# Patient Record
Sex: Female | Born: 1980
Health system: Southern US, Community
[De-identification: ages and names within clinical notes are randomized; demographics above are authoritative.]

## PROBLEM LIST (undated history)

## (undated) HISTORY — PX: MEDIAL COLLATERAL LIGAMENT REPAIR, KNEE: SHX2019

---

## 2014-09-11 ENCOUNTER — Other Ambulatory Visit: Payer: Self-pay | Admitting: Specialist

## 2014-09-20 ENCOUNTER — Ambulatory Visit: Payer: BLUE CROSS/BLUE SHIELD

## 2016-02-28 ENCOUNTER — Ambulatory Visit: Payer: Self-pay

## 2018-10-24 DIAGNOSIS — Z23 Encounter for immunization: Secondary | ICD-10-CM | POA: Diagnosis not present

## 2019-12-02 ENCOUNTER — Telehealth: Payer: BC Managed Care – PPO | Admitting: Nurse Practitioner

## 2019-12-02 ENCOUNTER — Encounter: Payer: Self-pay | Admitting: Emergency Medicine

## 2019-12-02 ENCOUNTER — Emergency Department: Payer: BC Managed Care – PPO

## 2019-12-02 ENCOUNTER — Other Ambulatory Visit: Payer: Self-pay

## 2019-12-02 DIAGNOSIS — R059 Cough, unspecified: Secondary | ICD-10-CM | POA: Diagnosis not present

## 2019-12-02 DIAGNOSIS — R0602 Shortness of breath: Secondary | ICD-10-CM | POA: Diagnosis not present

## 2019-12-02 DIAGNOSIS — J1282 Pneumonia due to coronavirus disease 2019: Secondary | ICD-10-CM | POA: Diagnosis not present

## 2019-12-02 DIAGNOSIS — U071 COVID-19: Secondary | ICD-10-CM | POA: Insufficient documentation

## 2019-12-02 DIAGNOSIS — J9811 Atelectasis: Secondary | ICD-10-CM | POA: Diagnosis not present

## 2019-12-02 LAB — BASIC METABOLIC PANEL
Anion gap: 13 (ref 5–15)
BUN: 10 mg/dL (ref 6–20)
CO2: 23 mmol/L (ref 22–32)
Calcium: 8.4 mg/dL — ABNORMAL LOW (ref 8.9–10.3)
Chloride: 107 mmol/L (ref 98–111)
Creatinine, Ser: 0.7 mg/dL (ref 0.44–1.00)
GFR, Estimated: >60 mL/min (ref >60)
Glucose, Bld: 170 mg/dL — ABNORMAL HIGH (ref 70–99)
Potassium: 3.4 mmol/L — ABNORMAL LOW (ref 3.5–5.1)
Sodium: 143 mmol/L (ref 135–145)

## 2019-12-02 LAB — CBC
HCT: 45.8 % (ref 36.0–46.0)
Hemoglobin: 15 g/dL (ref 12.0–15.0)
MCH: 25.8 pg — ABNORMAL LOW (ref 26.0–34.0)
MCHC: 32.8 g/dL (ref 30.0–36.0)
MCV: 78.7 fL — ABNORMAL LOW (ref 80.0–100.0)
Platelets: 299 10*3/uL (ref 150–400)
RBC: 5.82 MIL/uL — ABNORMAL HIGH (ref 3.87–5.11)
RDW: 14.3 % (ref 11.5–15.5)
WBC: 3.3 10*3/uL — ABNORMAL LOW (ref 4.0–10.5)
nRBC: 0 % (ref 0.0–0.2)

## 2019-12-02 LAB — TROPONIN I (HIGH SENSITIVITY): Troponin I (High Sensitivity): 3 ng/L (ref <18)

## 2019-12-02 MED ORDER — PREDNISONE 20 MG PO TABS: ORAL_TABLET | ORAL | 0 refills | Status: AC

## 2019-12-02 MED ORDER — ALBUTEROL SULFATE HFA 108 (90 BASE) MCG/ACT IN AERS: 2.0000 | INHALATION_SPRAY | Freq: Four times a day (QID) | RESPIRATORY_TRACT | 0 refills | Status: AC | PRN

## 2019-12-02 NOTE — Progress Notes (Signed)
E-Visit for Corona Virus Screening  We are sorry you are not feeling well. We are here to help!  You have tested positive for COVID-19, meaning that you were infected with the novel coronavirus and could give the germ to others.    You have been enrolled in MyChart Home Monitoring for COVID-19. Daily you will receive a questionnaire within the MyChart website. Our COVID-19 response team will be monitoring your responses daily.  Please continue isolation at home, for at least 10 days since the start of your symptoms and until you have had 24 hours with no fever (without taking a fever reducer) and with improving of symptoms.  Please continue good preventive care measures, including:  frequent hand-washing, avoid touching your face, cover coughs/sneezes, stay out of crowds and keep a 6 foot distance from others.  Follow up with your provider or go to the nearest hospital ED for re-assessment if fever/cough/breathlessness return.  The following symptoms may appear 2-14 days after exposure: . Fever . Cough . Shortness of breath or difficulty breathing . Chills . Repeated shaking with chills . Muscle pain . Headache . Sore throat . New loss of taste or smell . Fatigue . Congestion or runny nose . Nausea or vomiting . Diarrhea  Go to the nearest hospital ED for assessment if fever/cough/breathlessness are severe or illness seems like a threat to life.  It is vitally important that if you feel that you have an infection such as this virus or any other virus that you stay home and away from places where you may spread it to others.  You should avoid contact with people age 66 and older.   You can use medication such as A prescription inhaler called Albuterol MDI 90 mcg /actuation 2 puffs every 4 hours as needed for shortness of breath, wheezing, cough as well as prednisone 20mg  2 po daily for 5 days.  * If your oxygen sat drops below 90 you will need to go to the hospital.  You may also take  acetaminophen (Tylenol) as needed for fever.  Reduce your risk of any infection by using the same precautions used for avoiding the common cold or flu:  Wash your hands often with soap and warm water for at least 20 seconds.  If soap and water are not readily available, use an alcohol-based hand sanitizer with at least 60% alcohol.  . If coughing or sneezing, cover your mouth and nose by coughing or sneezing into the elbow areas of your shirt or coat, into a tissue or into your sleeve (not your hands). . Avoid shaking hands with others and consider head nods or verbal greetings only. . Avoid touching your eyes, nose, or mouth with unwashed hands.  . Avoid close contact with people who are sick. . Avoid places or events with large numbers of people in one location, like concerts or sporting events. . Carefully consider travel plans you have or are making. . If you are planning any travel outside or inside the [place], visit the CDC's Travelers' Health webpage for the latest health notices. . If you have some symptoms but not all symptoms, continue to monitor at home and seek medical attention if your symptoms worsen. . If you are having a medical emergency, call 911.  HOME CARE . Only take medications as instructed by your medical team. . Drink plenty of fluids and get plenty of rest. . A steam or ultrasonic humidifier can help if you have congestion.   GET  HELP RIGHT AWAY IF YOU HAVE EMERGENCY WARNING SIGNS** FOR COVID-19. If you or someone is showing any of these signs seek emergency medical care immediately. Call 911 or proceed to your closest emergency facility if: . You develop worsening high fever. . Trouble breathing . Bluish lips or face . Persistent pain or pressure in the chest . New confusion . Inability to wake or stay awake . You cough up blood. . Your symptoms become more severe .        Inability to hold down food or fluids **This list is not all possible symptoms. Contact  your medical provider for any symptoms that are sever or concerning to you.  MAKE SURE YOU   Understand these instructions.  Will watch your condition.  Will get help right away if you are not doing well or get worse.  Your e-visit answers were reviewed by a board certified advanced clinical practitioner to complete your personal care plan.  Depending on the condition, your plan could have included both over the counter or prescription medications.  If there is a problem please reply once you have received a response from your provider.  Your safety is important to [place].  If you have drug allergies check your prescription carefully.    You can use MyChart to ask questions about today's visit, request a non-urgent call back, or ask for a work or school excuse for 24 hours related to this e-Visit. If it has been greater than 24 hours you will need to follow up with your provider, or enter a new e-Visit to address those concerns. You will get an e-mail in the next two days asking about your experience.  I hope that your e-visit has been valuable and will speed your recovery. Thank you for using e-visits.   5-10 minutes spent reviewing and documenting in chart.

## 2019-12-02 NOTE — ED Triage Notes (Signed)
Patient states that she was diagnosed with Covid on 11/28/19. Patient states that she has had mild symptoms until today. Patient reports that she started feeling short of breath today.

## 2019-12-03 ENCOUNTER — Emergency Department
Admission: EM | Admit: 2019-12-03 | Discharge: 2019-12-03 | Disposition: A | Discharge status: Home / Self Care | Payer: BC Managed Care – PPO | Attending: Emergency Medicine | Admitting: Emergency Medicine

## 2019-12-03 DIAGNOSIS — J1282 Pneumonia due to coronavirus disease 2019: Secondary | ICD-10-CM

## 2019-12-03 DIAGNOSIS — U071 COVID-19: Secondary | ICD-10-CM

## 2019-12-03 LAB — TROPONIN I (HIGH SENSITIVITY): Troponin I (High Sensitivity): 3 ng/L

## 2019-12-03 MED ORDER — ALBUTEROL SULFATE (2.5 MG/3ML) 0.083% IN NEBU: 2.5000 mg | INHALATION_SOLUTION | RESPIRATORY_TRACT | 0 refills | Status: AC | PRN

## 2019-12-03 MED ORDER — SODIUM CHLORIDE 0.9 % IV SOLN
1200.0000 mg | Freq: Once | INTRAVENOUS | Status: AC
  Administered 2019-12-03: 1200 mg via INTRAVENOUS
  Filled 2019-12-03: qty 10

## 2019-12-03 MED ORDER — SODIUM CHLORIDE 0.9 % IV SOLN: 1200.0000 mg | Freq: Once | INTRAVENOUS | Status: DC

## 2019-12-03 MED ORDER — ALBUTEROL SULFATE HFA 108 (90 BASE) MCG/ACT IN AERS: 2.0000 | INHALATION_SPRAY | Freq: Once | RESPIRATORY_TRACT | Status: DC | PRN

## 2019-12-03 MED ORDER — DIPHENHYDRAMINE HCL 50 MG/ML IJ SOLN: 50.0000 mg | Freq: Once | INTRAMUSCULAR | Status: DC | PRN

## 2019-12-03 MED ORDER — METHYLPREDNISOLONE SODIUM SUCC 125 MG IJ SOLR: 125.0000 mg | Freq: Once | INTRAMUSCULAR | Status: DC | PRN

## 2019-12-03 MED ORDER — EPINEPHRINE 0.3 MG/0.3ML IJ SOAJ: 0.3000 mg | Freq: Once | INTRAMUSCULAR | Status: DC | PRN

## 2019-12-03 MED ORDER — SODIUM CHLORIDE 0.9 % IV SOLN: INTRAVENOUS | Status: DC | PRN

## 2019-12-03 MED ORDER — COMPRESSOR/NEBULIZER MISC: 1.0000 [IU] | 0 refills | Status: AC | PRN

## 2019-12-03 MED ORDER — FAMOTIDINE IN NACL 20-0.9 MG/50ML-% IV SOLN: 20.0000 mg | Freq: Once | INTRAVENOUS | Status: DC | PRN

## 2019-12-03 NOTE — Discharge Instructions (Signed)
Continue and finish steroid as directed by your doctor.  You may continue albuterol inhaler, and may also use nebulizer every 4 hours as needed for difficulty breathing.

## 2019-12-03 NOTE — ED Provider Notes (Signed)
[facility] Emergency Department Provider Note   ____________________________________________   First MD Initiated Contact with Patient 12/03/19 [phone]     (approximate)  I have reviewed the triage vital signs and the nursing notes.   HISTORY  Chief Complaint Shortness of Breath    HPI Patricia Watson is a 39 y.o. female who presents to the ED from home with a chief complaint of shortness of breath.  Patient is fully vaccinated against COVID-19.  Began to have mild symptoms of nasal congestion and fatigue on 11/23/2019.  She tested positive for COVID-19 on 11/28/2019.  Had an ED visit with her PCP yesterday and prescribed albuterol inhaler and prednisone 40 mg which she took yesterday.  She is not convinced if she is doing the inhaler correctly.  Reports fever and fatigue improved.  Now experiencing shortness of breath especially on exertion.  Denies chest pain, abdominal pain, nausea, vomiting or diarrhea.  Patient contracted COVID-19 from her young children who are too young to be vaccinated.      Past medical history None  There are no problems to display for this patient.   Past Surgical History:  Procedure Laterality Date  . MEDIAL COLLATERAL LIGAMENT REPAIR, KNEE Left     Prior to Admission medications   Medication Sig Start Date End Date Taking? Authorizing Provider  albuterol (PROVENTIL) (2.5 MG/3ML) 0.083% nebulizer solution Take 3 mLs (2.5 mg total) by nebulization every 4 (four) hours as needed for wheezing or shortness of breath. 12/03/19   Irean Hong, MD  albuterol (VENTOLIN HFA) 108 (90 Base) MCG/ACT inhaler Inhale 2 puffs into the lungs every 6 (six) hours as needed for wheezing or shortness of breath. 12/02/19   Daphine Deutscher Mary-Margaret, FNP  Nebulizers (COMPRESSOR/NEBULIZER) MISC 1 Units by Does not apply route every 4 (four) hours as needed. 12/03/19   Irean Hong, MD  predniSONE (DELTASONE) 20 MG tablet 2 po at sametime daily for 5 days  12/02/19   Bennie Pierini, FNP    Allergies Patient has no known allergies.  No family history on file.  Social History Social History   Tobacco Use  . Smoking status: Never Smoker  . Smokeless tobacco: Never Used  Substance Use Topics  . Alcohol use: Not Currently  . Drug use: Never    Review of Systems  Constitutional: No fever/chills Eyes: No visual changes. ENT: No sore throat. Cardiovascular: Denies chest pain. Respiratory: Positive for cough and shortness of breath. Gastrointestinal: No abdominal pain.  No nausea, no vomiting.  No diarrhea.  No constipation. Genitourinary: Negative for dysuria. Musculoskeletal: Negative for back pain. Skin: Negative for rash. Neurological: Negative for headaches, focal weakness or numbness.   ____________________________________________   PHYSICAL EXAM:  VITAL SIGNS: ED Triage Vitals  Enc Vitals Group     BP 12/02/19 2129 (!) 158/96     Pulse Rate 12/02/19 2129 79     Resp 12/02/19 2129 20     Temp 12/02/19 2129 98.5 F (36.9 C)     Temp Source 12/02/19 2129 Oral     SpO2 12/02/19 2129 96 %     Weight 12/02/19 2130 300 lb (136.1 kg)     Height 12/02/19 2130 5\' 8"  (1.727 m)     Head Circumference --      Peak Flow --      Pain Score 12/02/19 2130 0     Pain Loc --      Pain Edu? --      Excl.  in GC? --     Constitutional: Alert and oriented. Well appearing and in mild acute distress. Eyes: Conjunctivae are normal. PERRL. EOMI. Head: Atraumatic. Nose: No congestion/rhinnorhea. Mouth/Throat: Mucous membranes are mildly dry.   Neck: No stridor.   Cardiovascular: Normal rate, regular rhythm. Grossly normal heart sounds.  Good peripheral circulation. Respiratory: Normal respiratory effort.  No retractions. Lungs slightly diminished bibasilarly. Gastrointestinal: Soft and nontender. No distention. No abdominal bruits. No CVA tenderness. Musculoskeletal: No lower extremity tenderness nor edema.  No joint  effusions. Neurologic:  Normal speech and language. No gross focal neurologic deficits are appreciated. No gait instability. Skin:  Skin is warm, dry and intact. No rash noted.  No petechiae. Psychiatric: Mood and affect are normal. Speech and behavior are normal.  ____________________________________________   LABS (all labs ordered are listed, but only abnormal results are displayed)  Labs Reviewed  BASIC METABOLIC PANEL - Abnormal; Notable for the following components:      Result Value   Potassium 3.4 (*)    Glucose, Bld 170 (*)    Calcium 8.4 (*)    All other components within normal limits  CBC - Abnormal; Notable for the following components:   WBC 3.3 (*)    RBC 5.82 (*)    MCV 78.7 (*)    MCH 25.8 (*)    All other components within normal limits  POC URINE PREG, ED  TROPONIN I (HIGH SENSITIVITY)  TROPONIN I (HIGH SENSITIVITY)   ____________________________________________  EKG  ED ECG REPORT I, Devanie Galanti J, the attending physician, personally viewed and interpreted this ECG.   Date: 12/03/2019  EKG Time: 2131  Rate: 87  Rhythm: normal EKG, normal sinus rhythm  Axis: LAD  Intervals:none  ST&T Change: Nonspecific  ____________________________________________  RADIOLOGY I, Annarae Macnair J, personally viewed and evaluated these images (plain radiographs) as part of my medical decision making, as well as reviewing the written report by the radiologist.  ED MD interpretation: COVID-19 pneumonia  Official radiology report(s): DG Chest 2 View  Result Date: 12/02/2019 CLINICAL DATA:  Shortness of breath and history of COVID-19 positivity EXAM: CHEST - 2 VIEW COMPARISON:  None. FINDINGS: Cardiac shadow is within normal limits. Platelike atelectasis is noted as well as some patchy mild opacities consistent with the given clinical history. No bony abnormality is seen. No effusion is seen. IMPRESSION: Patchy opacities consistent with the given clinical history.  Electronically Signed   By: Alcide Clever M.D.   On: 12/02/2019 21:59    ____________________________________________   PROCEDURES  Procedure(s) performed (including Critical Care):  Procedures   ____________________________________________   INITIAL IMPRESSION / ASSESSMENT AND PLAN / ED COURSE  As part of my medical decision making, I reviewed the following data within the electronic MEDICAL RECORD NUMBER Nursing notes reviewed and incorporated, Labs reviewed, EKG interpreted, Old chart reviewed (12/02/2019 ED visit with PCP - results of positive Covid test), Radiograph reviewed and Notes from prior ED visits     39 year old with positive female standing with shortness of breath. Differential includes, but is not limited to, viral syndrome, bronchitis including COPD exacerbation, pneumonia, reactive airway disease including asthma, CHF including exacerbation with or without pulmonary/interstitial edema, pneumothorax, ACS, thoracic trauma, and pulmonary embolism.  Laboratory results unremarkable.  Chest x-ray consistent with COVID-19 pneumonia.  Will perform ambulation trial.  If patient is not hypoxic, I did discuss and offer antibody infusion which she accepts.   Clinical Course as of Dec 02 544  Sun Dec 03, 2019  0109 Room  air saturations remained 95% on ambulation.  Will order IV antibody infusion.   [JS]  0545 Addendum on chart review: Patient received IV antibody infusion and was discharged in good and stable condition. Strict return precautions were given. Patient verbalized understanding and agreed with plan of care.   [JS]    Clinical Course User Index [JS] Irean Hong, MD     ____________________________________________   FINAL CLINICAL IMPRESSION(S) / ED DIAGNOSES  Final diagnoses:  Pneumonia due to COVID-19 virus     ED Discharge Orders         Ordered    albuterol (PROVENTIL) (2.5 MG/3ML) 0.083% nebulizer solution  Every 4 hours PRN        12/03/19 0125     Nebulizers (COMPRESSOR/NEBULIZER) MISC  Every 4 hours PRN        12/03/19 0125          *Please note:  Patricia Watson was evaluated in Emergency Department on 12/03/2019 for the symptoms described in the history of present illness. She was evaluated in the context of the global COVID-19 pandemic, which necessitated consideration that the patient might be at risk for infection with the SARS-CoV-2 virus that causes COVID-19. Institutional protocols and algorithms that pertain to the evaluation of patients at risk for COVID-19 are in a state of rapid change based on information released by regulatory bodies including the CDC and federal and state organizations. These policies and algorithms were followed during the patient's care in the ED.  Some ED evaluations and interventions may be delayed as a result of limited staffing during and the pandemic.*   Note:  This document was prepared using Dragon voice recognition software and may include unintentional dictation errors.   Irean Hong, MD 12/03/19 [phone]

## 2019-12-03 NOTE — Progress Notes (Signed)
Pharmacy COVID-19 Monoclonal Antibody Screening  Patricia Watson was identified as being not hospitalized with symptoms from Covid-19 on admission but an incidental positive PCR has been documented.  The patient may qualify for the use of monoclonal antibodies (mAB) for COVID-19 viral infection to prevent worsening symptoms stemming from Covid-19 infection.  The patient was identified based on a positive COVID-19 PCR and not requiring the use of supplemental oxygen at this time.  This patient meets the FDA criteria for Emergency Use Authorization of casirivimab/imdevimab or bamlanivimab/etesevimab.  Has a (+) direct SARS-CoV-2 viral test result  Is NOT hospitalized due to COVID-19  Is within 10 days of symptom onset  Has at least one of the high risk factor(s) for progression to severe COVID-19 and/or hospitalization as defined in EUA.  Specific high risk criteria : BMI > 25  Additionally: The patient has had a positive COVID-19 PCR in the last 90 days.  The patient is fully vaccinated against COVID-19.  Since the patient is partially or fully vaccinated for COVID-19, has mild symptoms, and meets high risk criteria, the patient is eligible for mAB administration.   This eligibility and indication for treatment was discussed with the patient's physician: Dolores Frame  Plan: Based on the above discussion, it was decided that the patient will receive one dose of the available COVID-19 mAB combination. Pharmacy will coordinate administration timing with patient's nurse. Recommended infusion monitoring parameters communicated to the nursing team.   Sarthak Rubenstein D 12/03/2019  1:50 AM

## 2020-01-24 DIAGNOSIS — F411 Generalized anxiety disorder: Secondary | ICD-10-CM | POA: Diagnosis not present

## 2020-01-24 DIAGNOSIS — F322 Major depressive disorder, single episode, severe without psychotic features: Secondary | ICD-10-CM | POA: Diagnosis not present

## 2020-04-02 DIAGNOSIS — F411 Generalized anxiety disorder: Secondary | ICD-10-CM | POA: Diagnosis not present

## 2020-04-02 DIAGNOSIS — F322 Major depressive disorder, single episode, severe without psychotic features: Secondary | ICD-10-CM | POA: Diagnosis not present

## 2020-05-07 DIAGNOSIS — F411 Generalized anxiety disorder: Secondary | ICD-10-CM | POA: Diagnosis not present

## 2020-05-07 DIAGNOSIS — F322 Major depressive disorder, single episode, severe without psychotic features: Secondary | ICD-10-CM | POA: Diagnosis not present

## 2020-06-11 DIAGNOSIS — F322 Major depressive disorder, single episode, severe without psychotic features: Secondary | ICD-10-CM | POA: Diagnosis not present

## 2020-06-11 DIAGNOSIS — F411 Generalized anxiety disorder: Secondary | ICD-10-CM | POA: Diagnosis not present

## 2020-07-30 DIAGNOSIS — F411 Generalized anxiety disorder: Secondary | ICD-10-CM | POA: Diagnosis not present

## 2020-07-30 DIAGNOSIS — F322 Major depressive disorder, single episode, severe without psychotic features: Secondary | ICD-10-CM | POA: Diagnosis not present

## 2020-09-17 DIAGNOSIS — F411 Generalized anxiety disorder: Secondary | ICD-10-CM | POA: Diagnosis not present

## 2020-09-17 DIAGNOSIS — F322 Major depressive disorder, single episode, severe without psychotic features: Secondary | ICD-10-CM | POA: Diagnosis not present

## 2020-11-19 DIAGNOSIS — F322 Major depressive disorder, single episode, severe without psychotic features: Secondary | ICD-10-CM | POA: Diagnosis not present

## 2020-11-19 DIAGNOSIS — F411 Generalized anxiety disorder: Secondary | ICD-10-CM | POA: Diagnosis not present

## 2021-03-12 DIAGNOSIS — F411 Generalized anxiety disorder: Secondary | ICD-10-CM | POA: Diagnosis not present

## 2021-03-12 DIAGNOSIS — F322 Major depressive disorder, single episode, severe without psychotic features: Secondary | ICD-10-CM | POA: Diagnosis not present

## 2022-04-25 IMAGING — CR DG CHEST 2V
2 series · 2 of 2 positions shown · non-contrast
Comparison: None.

CLINICAL DATA: Shortness of breath and history of JEUHP-UI
positivity

EXAM:
CHEST - 2 VIEW

[chest pa]
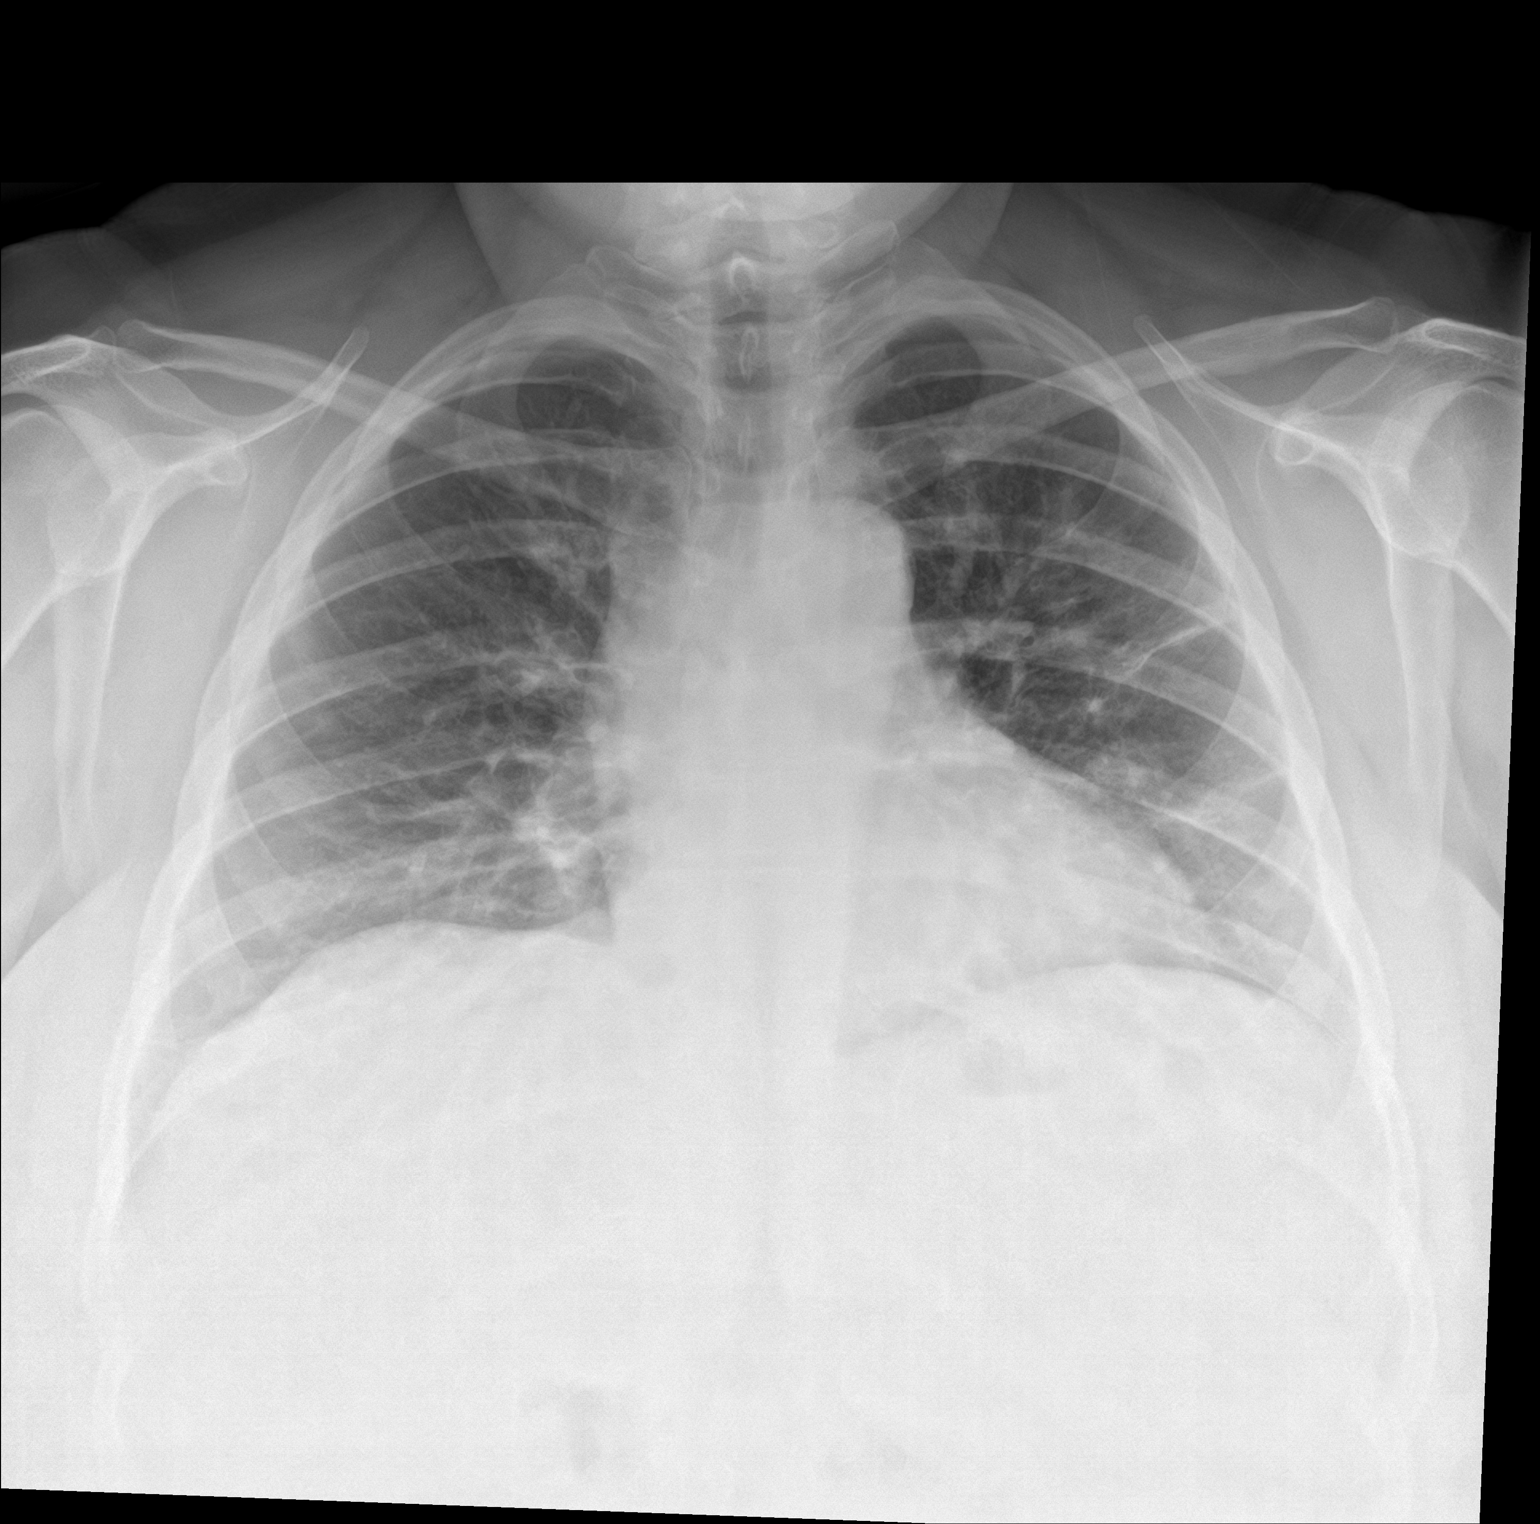

[chest lat]
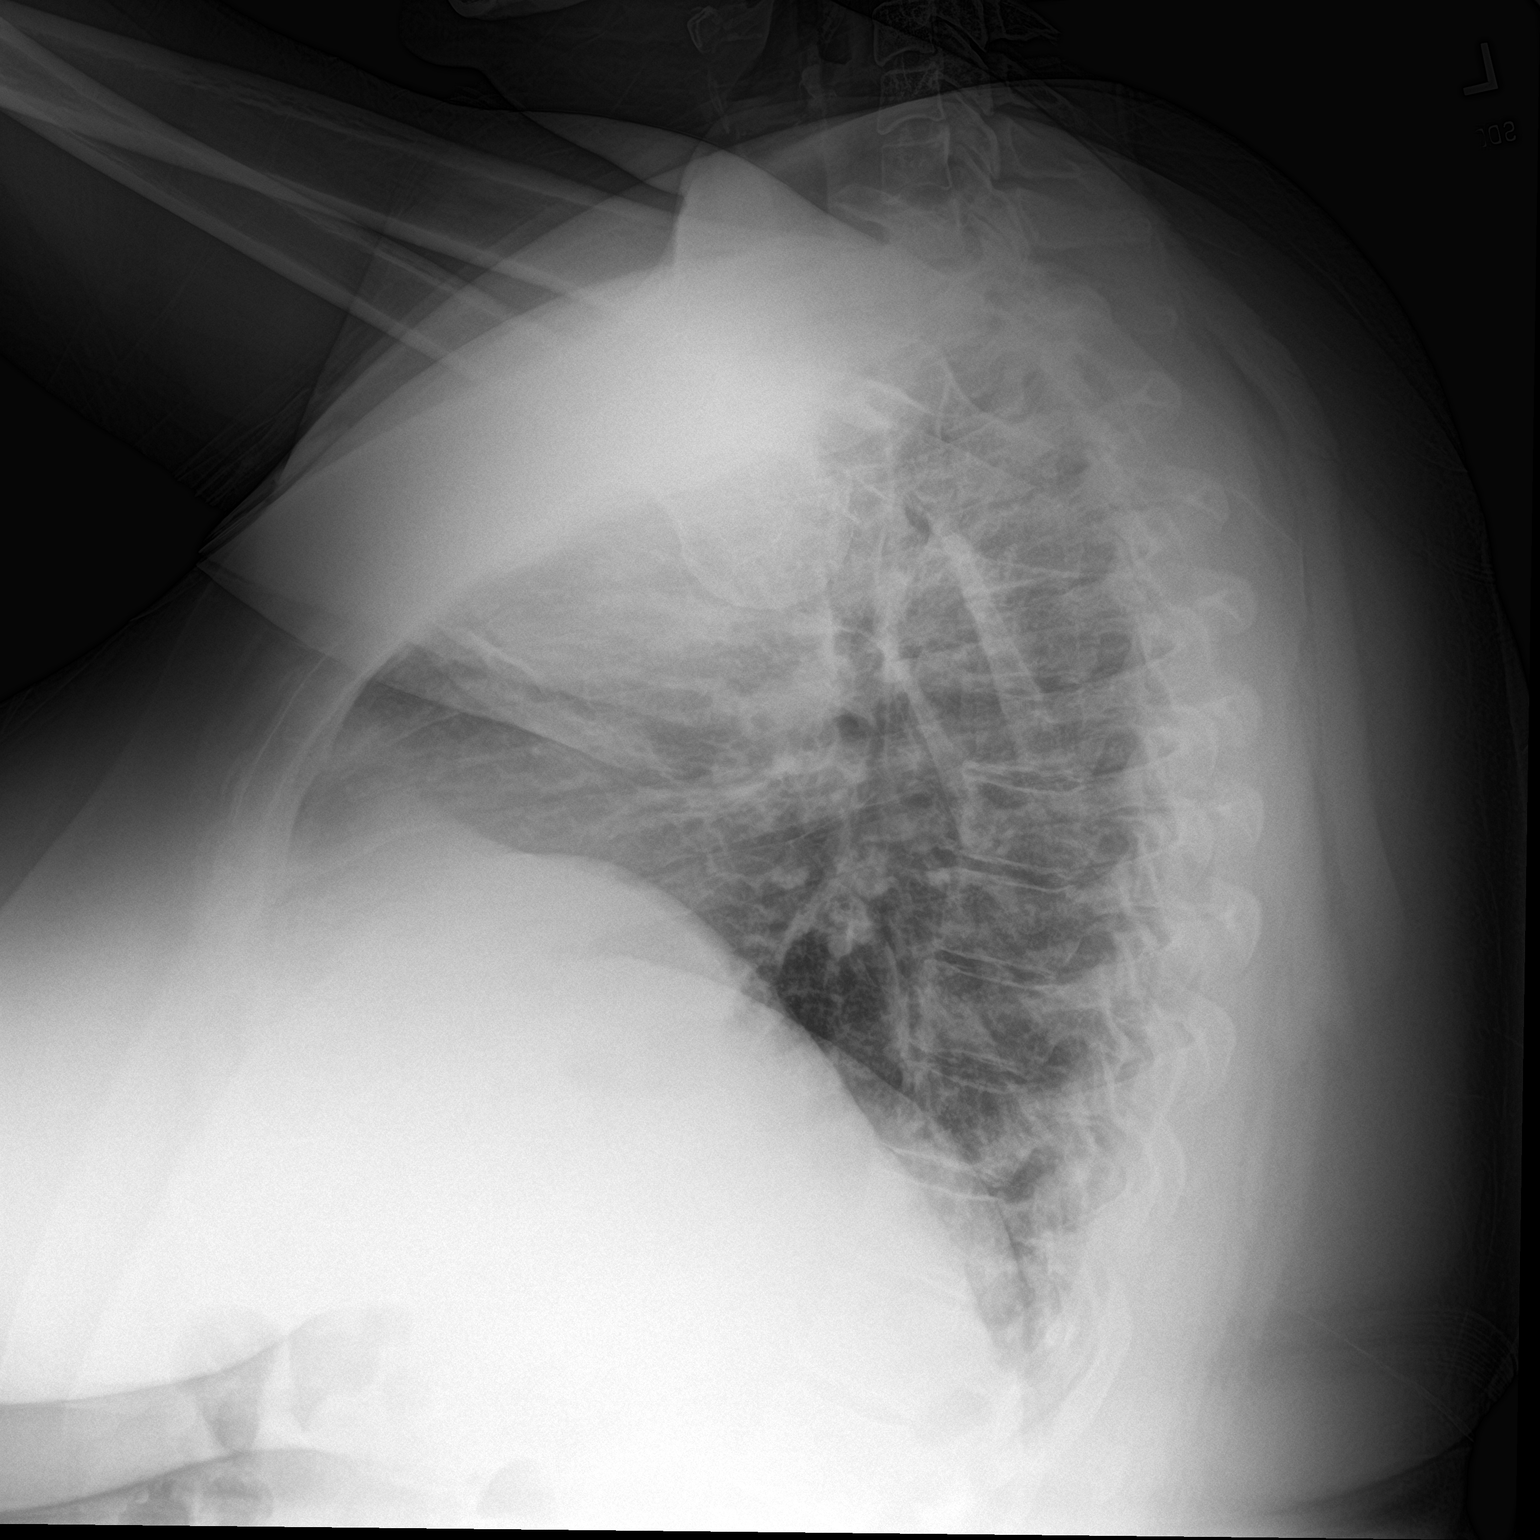

[2 of 2 positions shown; findings below may reference images not displayed]

FINDINGS: Cardiac shadow is within normal limits. Platelike atelectasis is
noted as well as some patchy mild opacities consistent with the
given clinical history. No bony abnormality is seen. No effusion is
seen.
IMPRESSION: Patchy opacities consistent with the given clinical history.
